# Patient Record
Sex: Male | Born: 1950 | Hispanic: No | Marital: Single | Smoking: Never smoker
Health system: Southern US, Community
[De-identification: ages and names within clinical notes are randomized; demographics above are authoritative.]

## PROBLEM LIST (undated history)

## (undated) DIAGNOSIS — E119 Type 2 diabetes mellitus without complications: Secondary | ICD-10-CM

---

## 2000-02-02 ENCOUNTER — Encounter: Payer: Self-pay | Admitting: Urology

## 2000-02-02 ENCOUNTER — Encounter: Admission: RE | Admit: 2000-02-02 | Discharge: 2000-02-02 | Payer: Self-pay | Admitting: Urology

## 2010-01-03 ENCOUNTER — Emergency Department (HOSPITAL_BASED_OUTPATIENT_CLINIC_OR_DEPARTMENT_OTHER): Admission: EM | Admit: 2010-01-03 | Discharge: 2010-01-04 | Payer: Self-pay | Admitting: Emergency Medicine

## 2010-01-03 ENCOUNTER — Ambulatory Visit: Payer: Self-pay | Admitting: Diagnostic Radiology

## 2010-01-04 ENCOUNTER — Ambulatory Visit: Payer: Self-pay | Admitting: Diagnostic Radiology

## 2010-08-14 LAB — POCT CARDIAC MARKERS

## 2010-08-14 LAB — COMPREHENSIVE METABOLIC PANEL
ALT: 23 U/L (ref 0–53)
AST: 17 U/L (ref 0–37)
Albumin: 3.5 g/dL (ref 3.5–5.2)
Alkaline Phosphatase: 97 U/L (ref 39–117)
BUN: 17 mg/dL (ref 6–23)
CO2: 26 mEq/L (ref 19–32)
Calcium: 9.2 mg/dL (ref 8.4–10.5)
Chloride: 102 mEq/L (ref 96–112)
Creatinine, Ser: 1.1 mg/dL (ref 0.4–1.5)
GFR calc Af Amer: >60 mL/min (ref >60)
GFR calc non Af Amer: >60 mL/min (ref >60)
Glucose, Bld: 149 mg/dL — ABNORMAL HIGH (ref 70–99)
Potassium: 3.9 mEq/L (ref 3.5–5.1)
Sodium: 140 mEq/L (ref 135–145)
Total Bilirubin: 0.7 mg/dL (ref 0.3–1.2)
Total Protein: 6.9 g/dL (ref 6.0–8.3)

## 2010-08-14 LAB — CBC
HCT: 39.3 % (ref 39.0–52.0)
Hemoglobin: 13.5 g/dL (ref 13.0–17.0)
MCH: 30.4 pg (ref 26.0–34.0)
MCHC: 34.4 g/dL (ref 30.0–36.0)
MCV: 88.2 fL (ref 78.0–100.0)
Platelets: 265 10*3/uL (ref 150–400)
RBC: 4.46 MIL/uL (ref 4.22–5.81)
RDW: 13.2 % (ref 11.5–15.5)
WBC: 8.6 10*3/uL (ref 4.0–10.5)

## 2011-10-01 IMAGING — CT CT ANGIO CHEST
3 of 11 series · 17 of 36 positions shown · IV contrast (omnipaque)
Comparison: 01/03/2010

CLINICAL DATA: Chest pain short of breath.

CT ANGIOGRAPHY CHEST WITH CONTRAST
TECHNIQUE: Multidetector CT imaging of the chest was performed
using the standard protocol during bolus administration of
intravenous contrast.  Multiplanar CT image reconstructions
including MIPs were obtained to evaluate the vascular anatomy.
Contrast:  160 ml Omnipaque 350 IV.  Two attempts were made each
with 80 ml of contrast.

[Series 4: pe 3.0 b25f · axial · 0.72mm/px · z∈[-243,-99]mm · 3 of 96 slices shown]
[im 24/96  lung]
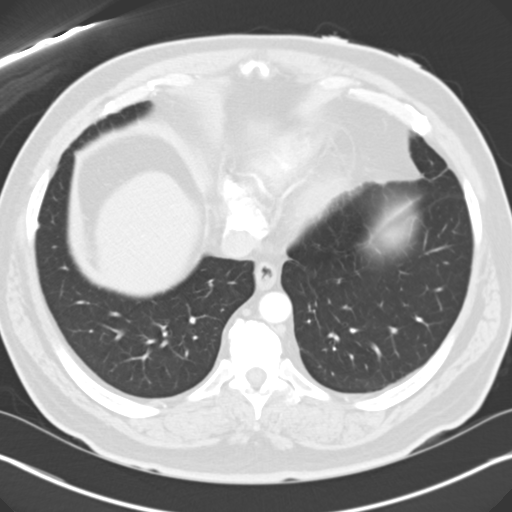
[im 48/96  lung]
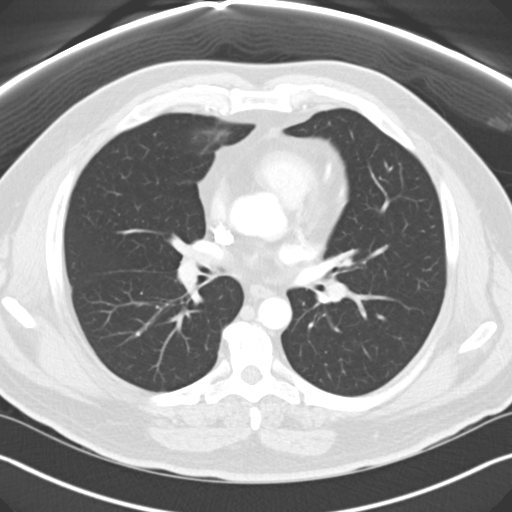
[im 72/96  lung]
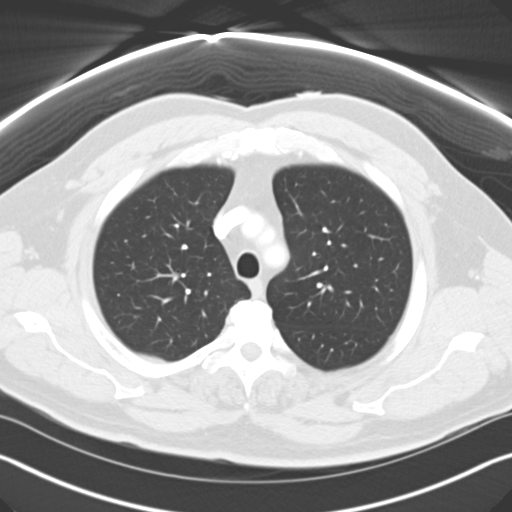

[Series 8: pe 1.0 b25f · axial · 0.71mm/px · z∈[-278,-53]mm · 13 of 263 slices shown]
[im 19/263  lung]
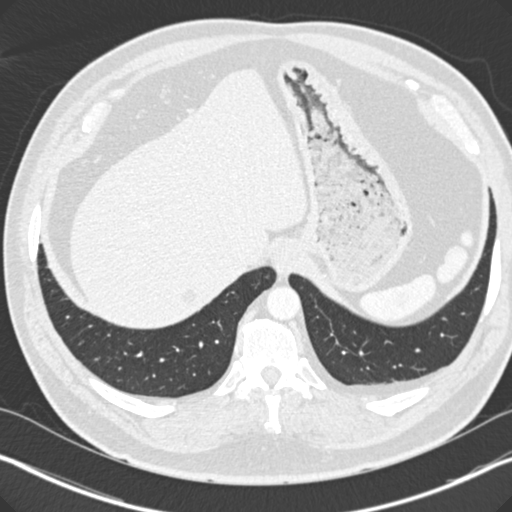
[im 38/263  mediastinal]
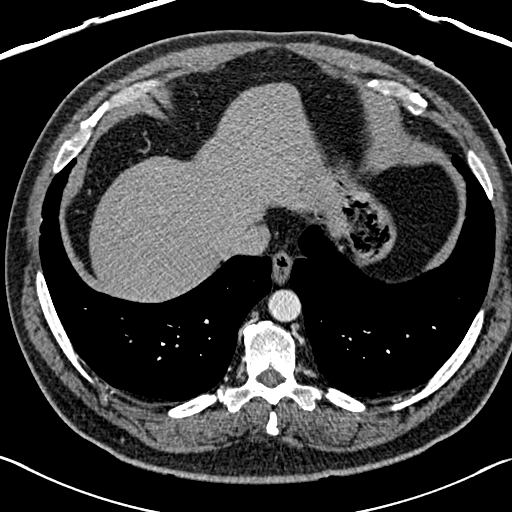
[im 57/263  lung]
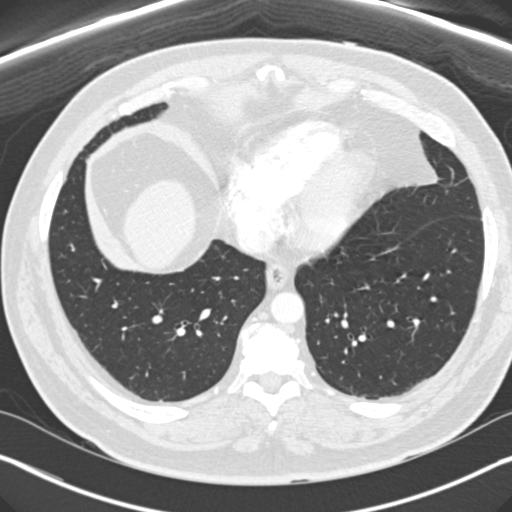
[im 75/263  mediastinal]
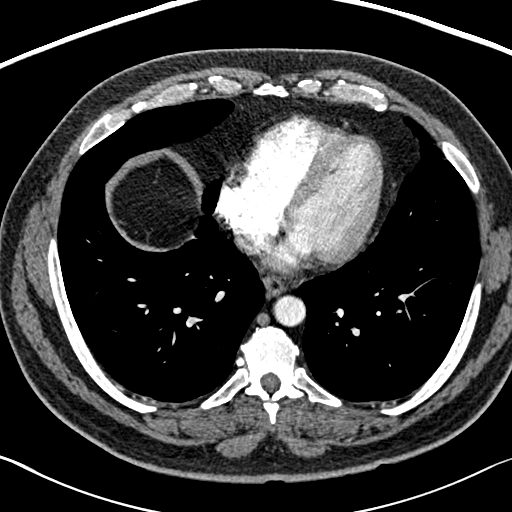
[im 94/263  lung]
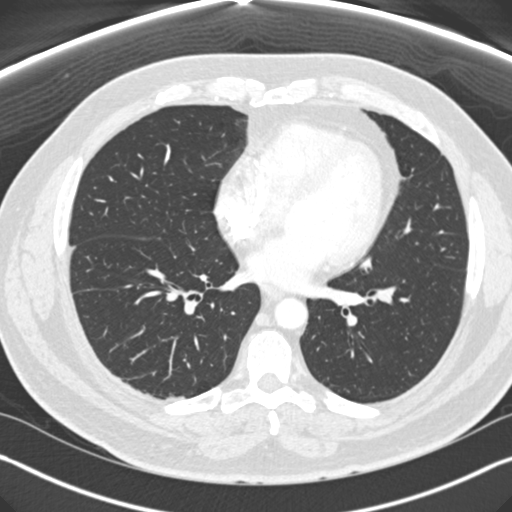
[im 113/263  mediastinal]
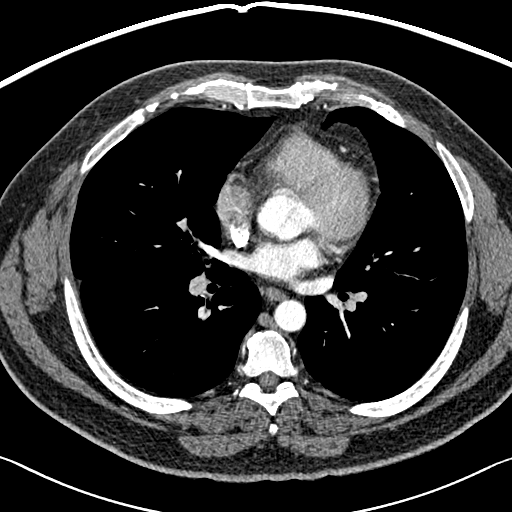
[im 132/263  lung]
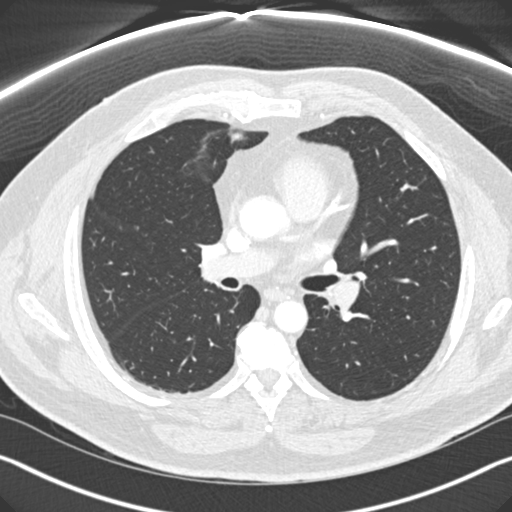
[im 150/263  mediastinal]
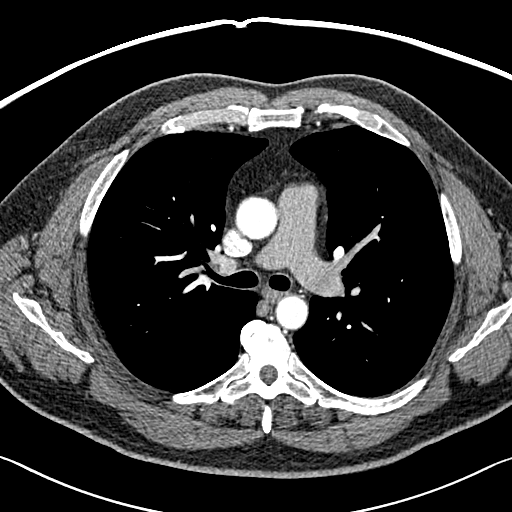
[im 169/263  lung]
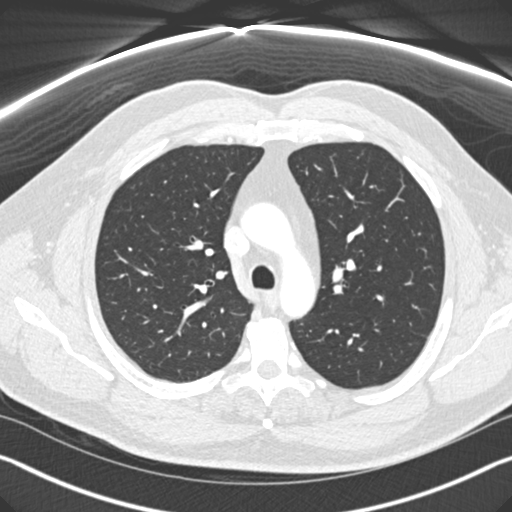
[im 188/263  mediastinal]
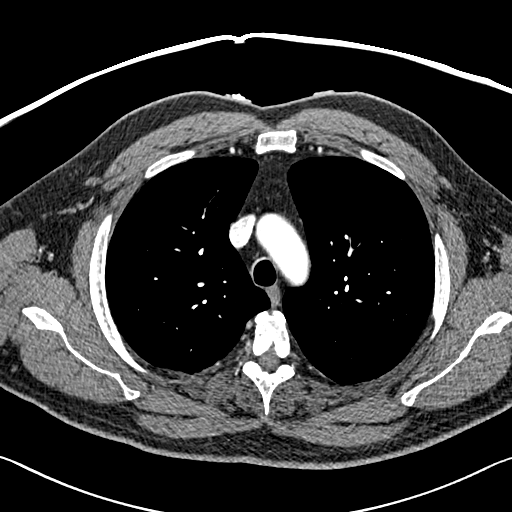
[im 206/263  lung]
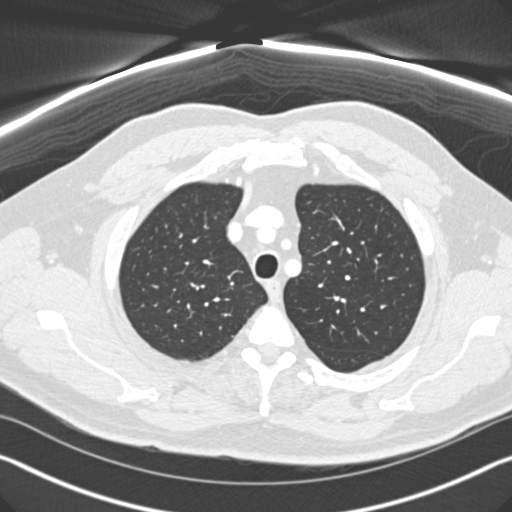
[im 225/263  mediastinal]
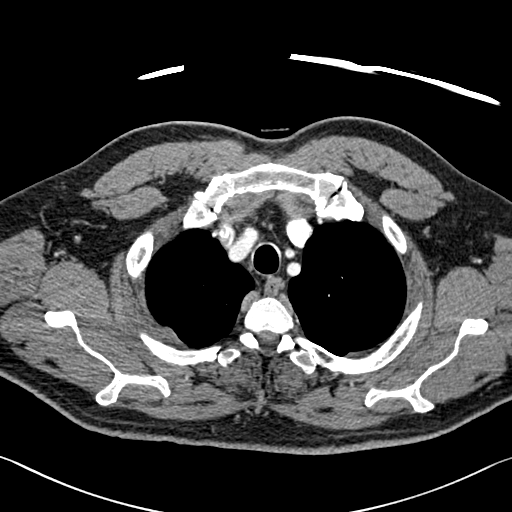
[im 244/263  lung]
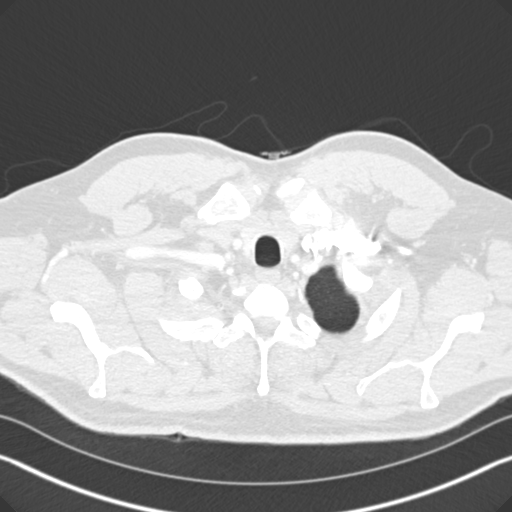

[Series 11: pe 2.0 coronal · coronal · 0.53mm/px · 1 of 153 slices shown]
[im 77/153  mediastinal]
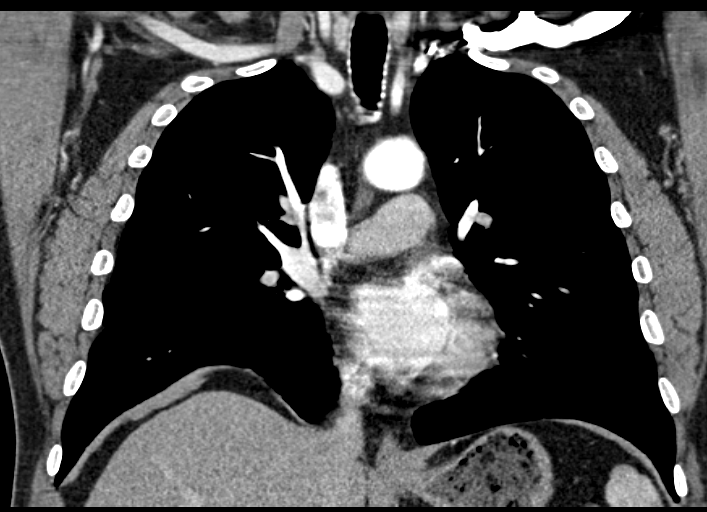

[17 of 36 positions shown; findings below may reference images not displayed]

FINDINGS: The patient was scanned twice.  On both attempts there
is excellent opacification of the aorta however the pulmonary
artery opacification  is insufficient for diagnosis of pulmonary
embolism.  This is due to technical factors of timing of the scan.
After two attempts, I did  not feel that further attempts were
warranted at this time.

The aorta is normal without dissection or aneurysm.  The lungs are
clear without infiltrate or effusion.  There is no mass or
adenopathy in the chest.  There is a small cyst in the dome of the
liver.

Review of the MIP images confirms the above findings.
IMPRESSION: Unfortunately, the study is nondiagnostic for pulmonary emboli.
Despite two attempts, there is lack of adequate pulmonary artery
opacification.  Diagnostic options would include ventilation
perfusion scan, or repeating the CT scan marker further hydration.
I discussed these options with Dr. Alexandra.

The lungs are clear.

## 2017-03-27 ENCOUNTER — Emergency Department (HOSPITAL_BASED_OUTPATIENT_CLINIC_OR_DEPARTMENT_OTHER)
Admission: EM | Admit: 2017-03-27 | Discharge: 2017-03-27 | Discharge status: Against Medical Advice | Payer: 59 | Attending: Emergency Medicine | Admitting: Emergency Medicine

## 2017-03-27 ENCOUNTER — Encounter (HOSPITAL_BASED_OUTPATIENT_CLINIC_OR_DEPARTMENT_OTHER): Payer: Self-pay | Admitting: Emergency Medicine

## 2017-03-27 DIAGNOSIS — Z5321 Procedure and treatment not carried out due to patient leaving prior to being seen by health care provider: Secondary | ICD-10-CM | POA: Insufficient documentation

## 2017-03-27 DIAGNOSIS — M25522 Pain in left elbow: Secondary | ICD-10-CM | POA: Insufficient documentation

## 2017-03-27 HISTORY — DX: Type 2 diabetes mellitus without complications: E11.9

## 2017-03-27 NOTE — ED Triage Notes (Signed)
Red, hot swollen area to L elbow.

## 2017-03-27 NOTE — ED Notes (Signed)
Pt reports that he is going to go to his PCP tomorrow that he does not want to wait any longer.  Updated on delay, talked with pt.  No distress noted. Ambulatory out of the dept.

## 2019-07-02 DEATH — deceased
